# Patient Record
Sex: Female | Born: 1945 | Race: White | Hispanic: No | Marital: Married | Smoking: Former smoker
Health system: Southern US, Community
[De-identification: ages and names within clinical notes are randomized; demographics above are authoritative.]

## PROBLEM LIST (undated history)

## (undated) DIAGNOSIS — T7840XA Allergy, unspecified, initial encounter: Secondary | ICD-10-CM

## (undated) DIAGNOSIS — I10 Essential (primary) hypertension: Secondary | ICD-10-CM

## (undated) HISTORY — DX: Allergy, unspecified, initial encounter: T78.40XA

## (undated) HISTORY — DX: Essential (primary) hypertension: I10

---

## 1992-03-05 HISTORY — PX: VAGINAL HYSTERECTOMY: SUR661

## 1999-11-27 ENCOUNTER — Encounter: Admission: RE | Admit: 1999-11-27 | Discharge: 1999-11-27 | Payer: Self-pay | Admitting: Internal Medicine

## 1999-11-27 ENCOUNTER — Encounter: Payer: Self-pay | Admitting: Internal Medicine

## 2000-09-19 ENCOUNTER — Other Ambulatory Visit: Admission: RE | Admit: 2000-09-19 | Discharge: 2000-09-19 | Payer: Self-pay | Admitting: Internal Medicine

## 2001-01-24 ENCOUNTER — Encounter: Admission: RE | Admit: 2001-01-24 | Discharge: 2001-01-24 | Payer: Self-pay | Admitting: Internal Medicine

## 2001-01-24 ENCOUNTER — Encounter: Payer: Self-pay | Admitting: Internal Medicine

## 2002-01-26 ENCOUNTER — Encounter: Payer: Self-pay | Admitting: Internal Medicine

## 2002-01-26 ENCOUNTER — Encounter: Admission: RE | Admit: 2002-01-26 | Discharge: 2002-01-26 | Payer: Self-pay | Admitting: Internal Medicine

## 2002-07-07 ENCOUNTER — Encounter: Admission: RE | Admit: 2002-07-07 | Discharge: 2002-10-05 | Payer: Self-pay | Admitting: Internal Medicine

## 2003-07-09 ENCOUNTER — Encounter: Admission: RE | Admit: 2003-07-09 | Discharge: 2003-07-09 | Payer: Self-pay | Admitting: Internal Medicine

## 2004-08-01 ENCOUNTER — Encounter: Admission: RE | Admit: 2004-08-01 | Discharge: 2004-08-01 | Payer: Self-pay | Admitting: Internal Medicine

## 2005-08-13 ENCOUNTER — Encounter: Admission: RE | Admit: 2005-08-13 | Discharge: 2005-08-13 | Payer: Self-pay | Admitting: Internal Medicine

## 2006-08-15 ENCOUNTER — Encounter: Admission: RE | Admit: 2006-08-15 | Discharge: 2006-08-15 | Payer: Self-pay | Admitting: Internal Medicine

## 2007-02-24 ENCOUNTER — Encounter: Admission: RE | Admit: 2007-02-24 | Discharge: 2007-02-24 | Payer: Self-pay | Admitting: Internal Medicine

## 2007-08-20 ENCOUNTER — Encounter: Admission: RE | Admit: 2007-08-20 | Discharge: 2007-08-20 | Payer: Self-pay | Admitting: Internal Medicine

## 2009-03-24 ENCOUNTER — Encounter: Admission: RE | Admit: 2009-03-24 | Discharge: 2009-03-24 | Payer: Self-pay | Admitting: Internal Medicine

## 2010-03-17 ENCOUNTER — Encounter
Admission: RE | Admit: 2010-03-17 | Discharge: 2010-03-17 | Payer: Self-pay | Source: Home / Self Care | Attending: Internal Medicine | Admitting: Internal Medicine

## 2010-03-26 ENCOUNTER — Encounter: Payer: Self-pay | Admitting: Internal Medicine

## 2010-03-27 ENCOUNTER — Encounter: Payer: Self-pay | Admitting: Internal Medicine

## 2010-04-21 ENCOUNTER — Other Ambulatory Visit: Payer: Self-pay | Admitting: Internal Medicine

## 2010-04-21 DIAGNOSIS — Z1231 Encounter for screening mammogram for malignant neoplasm of breast: Secondary | ICD-10-CM

## 2010-05-16 ENCOUNTER — Ambulatory Visit
Admission: RE | Admit: 2010-05-16 | Discharge: 2010-05-16 | Disposition: A | Discharge status: Home / Self Care | Payer: PRIVATE HEALTH INSURANCE | Source: Ambulatory Visit | Attending: Internal Medicine | Admitting: Internal Medicine

## 2010-05-16 DIAGNOSIS — Z1231 Encounter for screening mammogram for malignant neoplasm of breast: Secondary | ICD-10-CM

## 2011-04-11 ENCOUNTER — Other Ambulatory Visit: Payer: Self-pay | Admitting: Internal Medicine

## 2011-04-11 DIAGNOSIS — Z1231 Encounter for screening mammogram for malignant neoplasm of breast: Secondary | ICD-10-CM

## 2011-05-21 ENCOUNTER — Ambulatory Visit
Admission: RE | Admit: 2011-05-21 | Discharge: 2011-05-21 | Disposition: A | Discharge status: Home / Self Care | Payer: PRIVATE HEALTH INSURANCE | Source: Ambulatory Visit | Attending: Internal Medicine | Admitting: Internal Medicine

## 2011-05-21 DIAGNOSIS — Z1231 Encounter for screening mammogram for malignant neoplasm of breast: Secondary | ICD-10-CM

## 2012-05-21 ENCOUNTER — Other Ambulatory Visit: Payer: Self-pay

## 2012-06-23 ENCOUNTER — Ambulatory Visit
Admission: RE | Admit: 2012-06-23 | Discharge: 2012-06-23 | Disposition: A | Discharge status: Home / Self Care | Payer: Medicare Other | Source: Ambulatory Visit

## 2012-06-23 DIAGNOSIS — Z1231 Encounter for screening mammogram for malignant neoplasm of breast: Secondary | ICD-10-CM

## 2012-07-08 ENCOUNTER — Encounter: Payer: Self-pay | Admitting: Gastroenterology

## 2012-08-27 ENCOUNTER — Other Ambulatory Visit: Payer: Self-pay | Admitting: Internal Medicine

## 2012-08-27 DIAGNOSIS — Z780 Asymptomatic menopausal state: Secondary | ICD-10-CM

## 2012-09-12 ENCOUNTER — Ambulatory Visit
Admission: RE | Admit: 2012-09-12 | Discharge: 2012-09-12 | Disposition: A | Discharge status: Home / Self Care | Payer: Medicare Other | Source: Ambulatory Visit | Attending: Internal Medicine | Admitting: Internal Medicine

## 2012-09-12 DIAGNOSIS — Z780 Asymptomatic menopausal state: Secondary | ICD-10-CM

## 2013-06-23 ENCOUNTER — Encounter: Payer: Self-pay | Admitting: Gastroenterology

## 2013-06-23 ENCOUNTER — Other Ambulatory Visit: Payer: Self-pay

## 2013-06-23 DIAGNOSIS — Z1231 Encounter for screening mammogram for malignant neoplasm of breast: Secondary | ICD-10-CM

## 2013-07-28 ENCOUNTER — Encounter (INDEPENDENT_AMBULATORY_CARE_PROVIDER_SITE_OTHER): Payer: Self-pay

## 2013-07-28 ENCOUNTER — Ambulatory Visit
Admission: RE | Admit: 2013-07-28 | Discharge: 2013-07-28 | Disposition: A | Discharge status: Home / Self Care | Payer: Medicare Other | Source: Ambulatory Visit

## 2013-07-28 DIAGNOSIS — Z1231 Encounter for screening mammogram for malignant neoplasm of breast: Secondary | ICD-10-CM

## 2013-07-29 ENCOUNTER — Other Ambulatory Visit: Payer: Self-pay | Admitting: Internal Medicine

## 2013-07-29 DIAGNOSIS — R928 Other abnormal and inconclusive findings on diagnostic imaging of breast: Secondary | ICD-10-CM

## 2013-08-12 ENCOUNTER — Ambulatory Visit: Payer: Medicare Other

## 2013-08-12 ENCOUNTER — Ambulatory Visit (AMBULATORY_SURGERY_CENTER): Payer: Self-pay | Admitting: *Deleted

## 2013-08-12 VITALS — Ht 66.0 in | Wt 140.4 lb

## 2013-08-12 DIAGNOSIS — Z1211 Encounter for screening for malignant neoplasm of colon: Secondary | ICD-10-CM

## 2013-08-12 MED ORDER — NA SULFATE-K SULFATE-MG SULF 17.5-3.13-1.6 GM/177ML PO SOLN: 1.0000 | Freq: Once | ORAL | Status: DC

## 2013-08-12 NOTE — Progress Notes (Signed)
No allergies to eggs or soy. No problems with anesthesia.  Pt given Emmi instructions for colonoscopy  No oxygen use  No diet drug use  

## 2013-08-26 ENCOUNTER — Ambulatory Visit (AMBULATORY_SURGERY_CENTER): Payer: Medicare Other | Admitting: Gastroenterology

## 2013-08-26 ENCOUNTER — Encounter: Payer: Self-pay | Admitting: Gastroenterology

## 2013-08-26 VITALS — BP 134/75 | HR 60 | Temp 97.5°F | Resp 21 | Ht 66.0 in | Wt 140.0 lb

## 2013-08-26 DIAGNOSIS — Z1211 Encounter for screening for malignant neoplasm of colon: Secondary | ICD-10-CM

## 2013-08-26 MED ORDER — SODIUM CHLORIDE 0.9 % IV SOLN: 500.0000 mL | INTRAVENOUS | Status: DC

## 2013-08-26 NOTE — Progress Notes (Signed)
Report to PACU, RN, vss, BBS= Clear.  

## 2013-08-26 NOTE — Patient Instructions (Signed)
YOU HAD AN ENDOSCOPIC PROCEDURE TODAY AT THE [facility]: Refer to the procedure report that was given to you for any specific questions about what was found during the examination.  If the procedure report does not answer your questions, please call your gastroenterologist to clarify.  If you requested that your care partner not be given the details of your procedure findings, then the procedure report has been included in a sealed envelope for you to review at your convenience later.  YOU SHOULD EXPECT: Some feelings of bloating in the abdomen. Passage of more gas than usual.  Walking can help get rid of the air that was put into your GI tract during the procedure and reduce the bloating. If you had a lower endoscopy (such as a colonoscopy or flexible sigmoidoscopy) you may notice spotting of blood in your stool or on the toilet paper. If you underwent a bowel prep for your procedure, then you may not have a normal bowel movement for a few days.  DIET: Your first meal following the procedure should be a light meal and then it is ok to progress to your normal diet.  A half-sandwich or bowl of soup is an example of a good first meal.  Heavy or fried foods are harder to digest and may make you feel nauseous or bloated.  Likewise meals heavy in dairy and vegetables can cause extra gas to form and this can also increase the bloating.  Drink plenty of fluids but you should avoid alcoholic beverages for 24 hours.  ACTIVITY: Your care partner should take you home directly after the procedure.  You should plan to take it easy, moving slowly for the rest of the day.  You can resume normal activity the day after the procedure however you should NOT DRIVE or use heavy machinery for 24 hours (because of the sedation medicines used during the test).    SYMPTOMS TO REPORT IMMEDIATELY: A gastroenterologist can be reached at any hour.  During normal business hours, 8:30 AM to 5:00 PM Monday through Friday,  call [phone].  After hours and on weekends, please call the GI answering service at [phone] who will take a message and have the physician on call contact you.   Following lower endoscopy (colonoscopy or flexible sigmoidoscopy):  Excessive amounts of blood in the stool  Significant tenderness or worsening of abdominal pains  Swelling of the abdomen that is new, acute  Fever of 100F or higher    FOLLOW UP: If any biopsies were taken you will be contacted by phone or by letter within the next 1-3 weeks.  Call your gastroenterologist if you have not heard about the biopsies in 3 weeks.  Our staff will call the home number listed on your records the next business day following your procedure to check on you and address any questions or concerns that you may have at that time regarding the information given to you following your procedure. This is a courtesy call and so if there is no answer at the home number and we have not heard from you through the emergency physician on call, we will assume that you have returned to your regular daily activities without incident.  SIGNATURES/CONFIDENTIALITY: You and/or your care partner have signed paperwork which will be entered into your electronic medical record.  These signatures attest to the fact that that the information above on your After Visit Summary has been reviewed and is understood.  Full responsibility of the confidentiality   of this discharge information lies with you and/or your care-partner.     

## 2013-08-26 NOTE — Op Note (Signed)
[facility] [address] [place], [id number]   COLONOSCOPY PROCEDURE REPORT  PATIENT: Jillian MangoOwens, Jillian P.  MR#: [id number] BIRTHDATE: [birth year] , 68  yrs. old GENDER: Female ENDOSCOPIST: Louis Meckelobert D Kaplan, MD REFERRED AO:ZHYQMBY:Edwin Chilton SiGreen, M.D. PROCEDURE DATE:  08/26/2013 PROCEDURE:   Colonoscopy, diagnostic First Screening Colonoscopy - Avg.  risk and is 50 yrs.  old or older - No.  Prior Negative Screening - Now for repeat screening. 10 or more years since last screening  History of Adenoma - Now for follow-up colonoscopy & has been > or = to 3 yrs.  N/A  Polyps Removed Today? No.  Recommend repeat exam, <10 yrs? No. ASA CLASS:   Class II INDICATIONS:Average risk patient for colon cancer. MEDICATIONS: MAC sedation, administered by CRNA and Propofol (Diprivan) 230 mg IV  DESCRIPTION OF PROCEDURE:   After the risks benefits and alternatives of the procedure were thoroughly explained, informed consent was obtained.  A digital rectal exam revealed no abnormalities of the rectum.   The LB [id number]  endoscope was introduced through the anus and advanced to the cecum, which was identified by both the appendix and ileocecal valve. No adverse events experienced.   The quality of the prep was excellent using Suprep  The instrument was then slowly withdrawn as the colon was fully examined.      COLON FINDINGS: A normal appearing cecum, ileocecal valve, and appendiceal orifice were identified.  The ascending, hepatic flexure, transverse, splenic flexure, descending, sigmoid colon and rectum appeared unremarkable.  No polyps or cancers were seen. Retroflexed views revealed no abnormalities. The time to cecum=2 minutes 46 seconds.  Withdrawal time=7 minutes 56 seconds.  The scope was withdrawn and the procedure completed. COMPLICATIONS: There were no complications.  ENDOSCOPIC IMPRESSION: Normal colon  RECOMMENDATIONS: Continue current colorectal screening  recommendations for "routine risk" patients with a repeat colonoscopy in 10 years.   eSigned:  Louis Meckelobert D Kaplan, MD 08/26/2013 11:45 AM   cc:

## 2013-08-27 ENCOUNTER — Telehealth: Payer: Self-pay | Admitting: *Deleted

## 2013-08-27 NOTE — Telephone Encounter (Signed)
No answer, left message to call if questions or concerns. 

## 2013-09-01 ENCOUNTER — Ambulatory Visit
Admission: RE | Admit: 2013-09-01 | Discharge: 2013-09-01 | Disposition: A | Discharge status: Home / Self Care | Payer: Medicare Other | Source: Ambulatory Visit | Attending: Internal Medicine | Admitting: Internal Medicine

## 2013-09-01 ENCOUNTER — Encounter (INDEPENDENT_AMBULATORY_CARE_PROVIDER_SITE_OTHER): Payer: Self-pay

## 2013-09-01 DIAGNOSIS — R928 Other abnormal and inconclusive findings on diagnostic imaging of breast: Secondary | ICD-10-CM

## 2013-10-12 ENCOUNTER — Other Ambulatory Visit (HOSPITAL_COMMUNITY): Payer: Self-pay | Admitting: Unknown Physician Specialty

## 2013-10-12 DIAGNOSIS — R079 Chest pain, unspecified: Secondary | ICD-10-CM

## 2013-10-19 ENCOUNTER — Ambulatory Visit (HOSPITAL_COMMUNITY)
Admission: RE | Admit: 2013-10-19 | Discharge: 2013-10-19 | Disposition: A | Discharge status: Home / Self Care | Payer: Medicare Other | Source: Ambulatory Visit | Attending: Internal Medicine | Admitting: Internal Medicine

## 2013-10-19 DIAGNOSIS — R079 Chest pain, unspecified: Secondary | ICD-10-CM | POA: Diagnosis not present

## 2014-03-08 DIAGNOSIS — J019 Acute sinusitis, unspecified: Secondary | ICD-10-CM | POA: Diagnosis not present

## 2014-03-24 DIAGNOSIS — I10 Essential (primary) hypertension: Secondary | ICD-10-CM | POA: Diagnosis not present

## 2014-03-24 DIAGNOSIS — Z23 Encounter for immunization: Secondary | ICD-10-CM | POA: Diagnosis not present

## 2014-08-10 ENCOUNTER — Other Ambulatory Visit: Payer: Self-pay

## 2014-08-10 DIAGNOSIS — Z1231 Encounter for screening mammogram for malignant neoplasm of breast: Secondary | ICD-10-CM

## 2014-08-17 ENCOUNTER — Ambulatory Visit
Admission: RE | Admit: 2014-08-17 | Discharge: 2014-08-17 | Disposition: A | Discharge status: Home / Self Care | Payer: Medicare Other | Source: Ambulatory Visit

## 2014-08-17 DIAGNOSIS — Z1231 Encounter for screening mammogram for malignant neoplasm of breast: Secondary | ICD-10-CM | POA: Diagnosis not present

## 2014-08-17 DIAGNOSIS — H524 Presbyopia: Secondary | ICD-10-CM | POA: Diagnosis not present

## 2014-08-17 DIAGNOSIS — H52223 Regular astigmatism, bilateral: Secondary | ICD-10-CM | POA: Diagnosis not present

## 2014-08-17 DIAGNOSIS — H5213 Myopia, bilateral: Secondary | ICD-10-CM | POA: Diagnosis not present

## 2015-02-01 DIAGNOSIS — E785 Hyperlipidemia, unspecified: Secondary | ICD-10-CM | POA: Diagnosis not present

## 2015-02-01 DIAGNOSIS — I10 Essential (primary) hypertension: Secondary | ICD-10-CM | POA: Diagnosis not present

## 2015-02-01 DIAGNOSIS — D559 Anemia due to enzyme disorder, unspecified: Secondary | ICD-10-CM | POA: Diagnosis not present

## 2015-02-03 DIAGNOSIS — Z23 Encounter for immunization: Secondary | ICD-10-CM | POA: Diagnosis not present

## 2015-02-03 DIAGNOSIS — Z0000 Encounter for general adult medical examination without abnormal findings: Secondary | ICD-10-CM | POA: Diagnosis not present

## 2015-02-17 ENCOUNTER — Ambulatory Visit
Admission: RE | Admit: 2015-02-17 | Discharge: 2015-02-17 | Disposition: A | Discharge status: Home / Self Care | Payer: Medicare Other | Source: Ambulatory Visit | Attending: Internal Medicine | Admitting: Internal Medicine

## 2015-02-17 ENCOUNTER — Other Ambulatory Visit: Payer: Self-pay | Admitting: Internal Medicine

## 2015-02-17 DIAGNOSIS — M25551 Pain in right hip: Secondary | ICD-10-CM | POA: Diagnosis not present

## 2015-02-17 DIAGNOSIS — S79911A Unspecified injury of right hip, initial encounter: Secondary | ICD-10-CM | POA: Diagnosis not present

## 2015-02-17 DIAGNOSIS — R52 Pain, unspecified: Secondary | ICD-10-CM

## 2015-02-17 DIAGNOSIS — S76312A Strain of muscle, fascia and tendon of the posterior muscle group at thigh level, left thigh, initial encounter: Secondary | ICD-10-CM | POA: Diagnosis not present

## 2015-04-28 DIAGNOSIS — J019 Acute sinusitis, unspecified: Secondary | ICD-10-CM | POA: Diagnosis not present

## 2015-08-12 ENCOUNTER — Other Ambulatory Visit: Payer: Self-pay | Admitting: Internal Medicine

## 2015-08-12 DIAGNOSIS — N6453 Retraction of nipple: Secondary | ICD-10-CM

## 2015-08-12 DIAGNOSIS — Z1231 Encounter for screening mammogram for malignant neoplasm of breast: Secondary | ICD-10-CM

## 2015-08-19 ENCOUNTER — Ambulatory Visit
Admission: RE | Admit: 2015-08-19 | Discharge: 2015-08-19 | Disposition: A | Discharge status: Home / Self Care | Payer: Medicare Other | Source: Ambulatory Visit | Attending: Internal Medicine | Admitting: Internal Medicine

## 2015-08-19 ENCOUNTER — Other Ambulatory Visit: Payer: Self-pay | Admitting: Internal Medicine

## 2015-08-19 DIAGNOSIS — N63 Unspecified lump in breast: Secondary | ICD-10-CM | POA: Diagnosis not present

## 2015-08-19 DIAGNOSIS — N6320 Unspecified lump in the left breast, unspecified quadrant: Secondary | ICD-10-CM

## 2015-08-19 DIAGNOSIS — N6453 Retraction of nipple: Secondary | ICD-10-CM

## 2015-08-19 DIAGNOSIS — R922 Inconclusive mammogram: Secondary | ICD-10-CM | POA: Diagnosis not present

## 2015-08-25 ENCOUNTER — Other Ambulatory Visit: Payer: Self-pay | Admitting: Internal Medicine

## 2015-08-25 ENCOUNTER — Ambulatory Visit
Admission: RE | Admit: 2015-08-25 | Discharge: 2015-08-25 | Disposition: A | Discharge status: Home / Self Care | Payer: Medicare Other | Source: Ambulatory Visit | Attending: Internal Medicine | Admitting: Internal Medicine

## 2015-08-25 DIAGNOSIS — N6320 Unspecified lump in the left breast, unspecified quadrant: Secondary | ICD-10-CM

## 2015-08-25 DIAGNOSIS — N6489 Other specified disorders of breast: Secondary | ICD-10-CM | POA: Diagnosis not present

## 2015-09-17 ENCOUNTER — Other Ambulatory Visit: Payer: Self-pay | Admitting: Internal Medicine

## 2015-09-20 ENCOUNTER — Other Ambulatory Visit: Payer: Self-pay | Admitting: Internal Medicine

## 2015-09-20 DIAGNOSIS — N6453 Retraction of nipple: Secondary | ICD-10-CM

## 2015-09-25 ENCOUNTER — Ambulatory Visit
Admission: RE | Admit: 2015-09-25 | Discharge: 2015-09-25 | Disposition: A | Discharge status: Home / Self Care | Payer: Medicare Other | Source: Ambulatory Visit | Attending: Internal Medicine | Admitting: Internal Medicine

## 2015-09-25 DIAGNOSIS — N6453 Retraction of nipple: Secondary | ICD-10-CM

## 2015-09-25 DIAGNOSIS — N6459 Other signs and symptoms in breast: Secondary | ICD-10-CM | POA: Diagnosis not present

## 2015-09-25 MED ORDER — GADOBENATE DIMEGLUMINE 529 MG/ML IV SOLN
13.0000 mL | Freq: Once | INTRAVENOUS | Status: AC | PRN
  Administered 2015-09-25: 13 mL via INTRAVENOUS

## 2016-02-08 DIAGNOSIS — I10 Essential (primary) hypertension: Secondary | ICD-10-CM | POA: Diagnosis not present

## 2016-02-08 DIAGNOSIS — Z23 Encounter for immunization: Secondary | ICD-10-CM | POA: Diagnosis not present

## 2016-02-08 DIAGNOSIS — Z0001 Encounter for general adult medical examination with abnormal findings: Secondary | ICD-10-CM | POA: Diagnosis not present

## 2016-05-29 DIAGNOSIS — I10 Essential (primary) hypertension: Secondary | ICD-10-CM | POA: Diagnosis not present

## 2016-05-29 DIAGNOSIS — H10019 Acute follicular conjunctivitis, unspecified eye: Secondary | ICD-10-CM | POA: Diagnosis not present

## 2016-07-02 DIAGNOSIS — M25511 Pain in right shoulder: Secondary | ICD-10-CM | POA: Diagnosis not present

## 2016-11-09 IMAGING — MR MR BREAST BILAT WO/W CM
8 of 12 series · 33 of 48 positions shown · IV contrast (multihance)
Comparison: Previous exam(s).

CLINICAL DATA: Left nipple inversion with no clear mammographic or
sonographic correlate/cause.

LABS:  Creatinine 0.9 mg/dl, GFR 62
EXAM:
BILATERAL BREAST MRI WITH AND WITHOUT CONTRAST
TECHNIQUE: Multiplanar, multisequence MR images of both breasts were obtained
prior to and following the intravenous administration of 13 ml of
MultiHance.

[Series 2: t2_tirm_tra ipat (a-p) · axial · 3.0mm · 0.70mm/px · 1 of 55 slices shown]
[im 1/55]
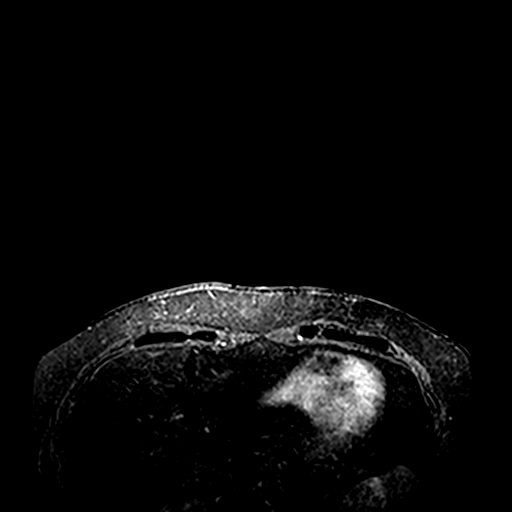

[Series 3: fl3d pre-cm no · axial · non-contrast · 1.2mm · 0.94mm/px · z∈[-65,+107]mm · 5 of 144 slices shown]
[im 1/144]
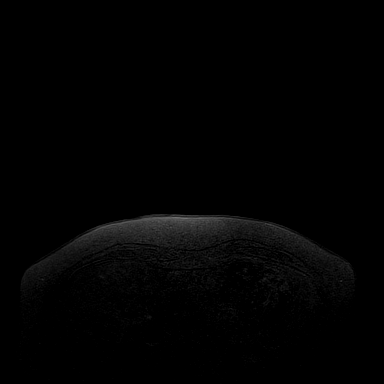
[im 36/144]
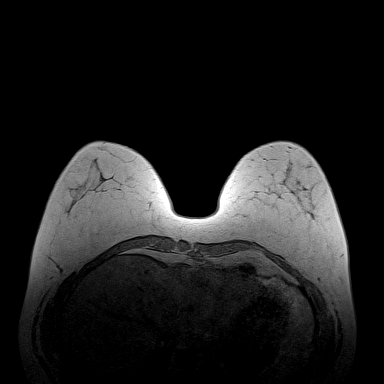
[im 72/144]
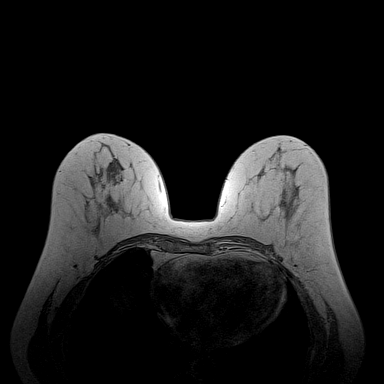
[im 108/144]
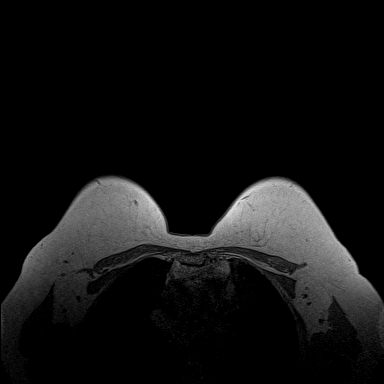
[im 144/144]
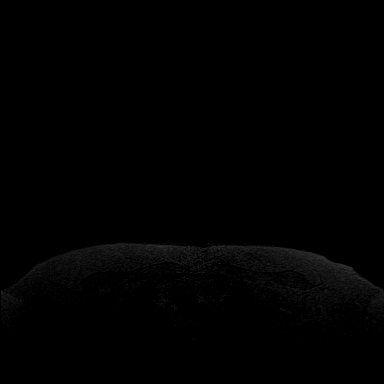

[Series 4: fl3d pre-cm · axial · non-contrast · 1.2mm · 0.94mm/px · z∈[-65,+107]mm · 5 of 144 slices shown]
[im 1/144]
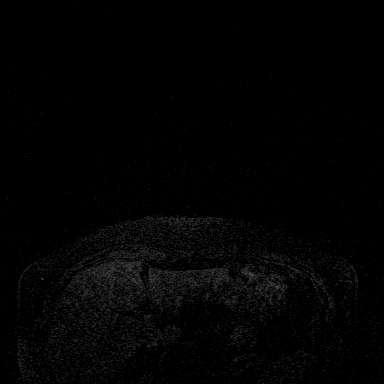
[im 36/144]
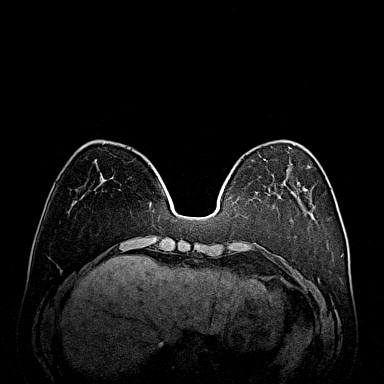
[im 72/144]
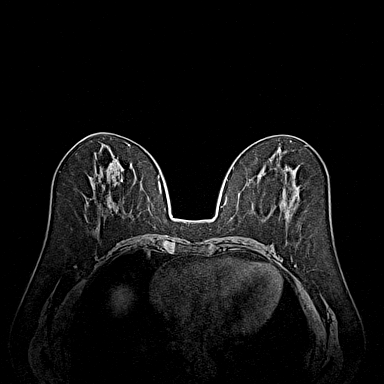
[im 108/144]
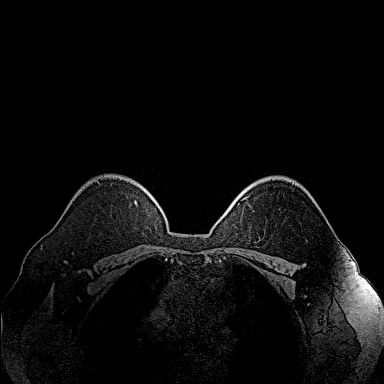
[im 144/144]
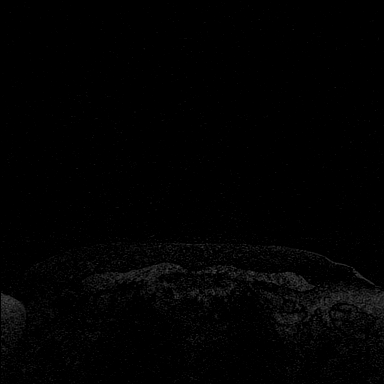

[Series 5: fl3d post immediate · axial · 1.2mm · 0.94mm/px · z∈[-65,+107]mm · 5 of 144 slices shown (1 of 3)]
[im 1/144]
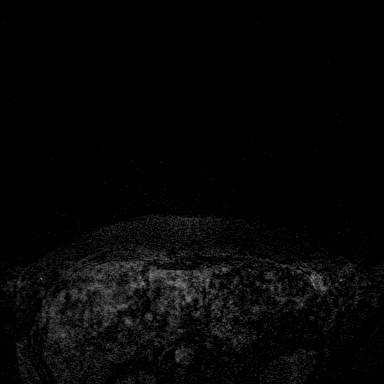
[im 36/144]
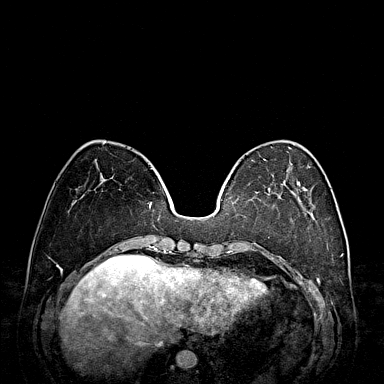
[im 72/144]
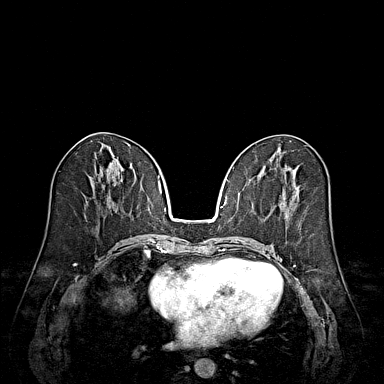
[im 108/144]
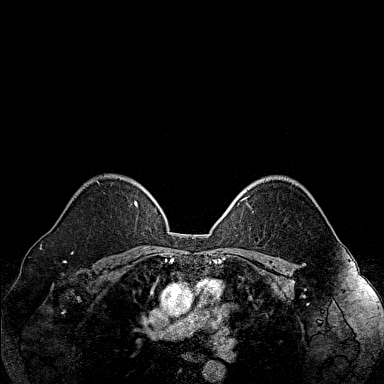
[im 144/144]
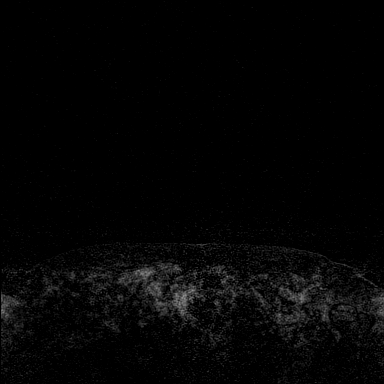

[Series 6: fl3d post immediate · axial · 1.2mm · 0.94mm/px · z∈[-65,+107]mm · 5 of 144 slices shown (2 of 3)]
[im 1/144]
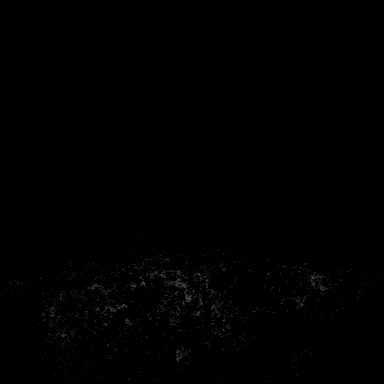
[im 36/144]
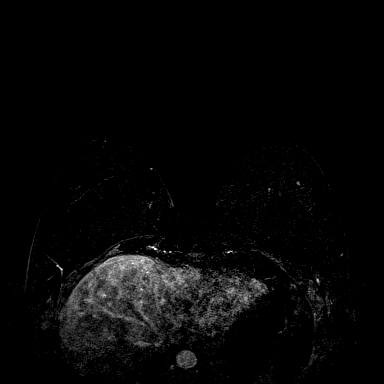
[im 72/144]
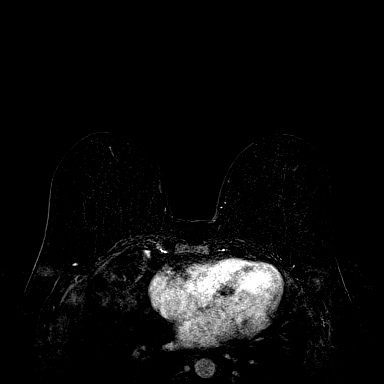
[im 108/144]
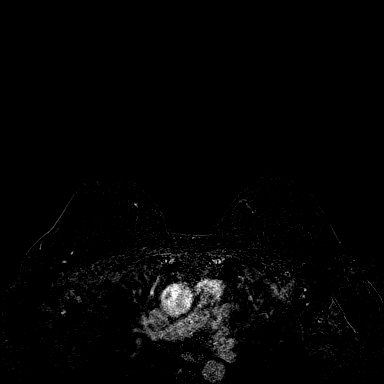
[im 144/144]
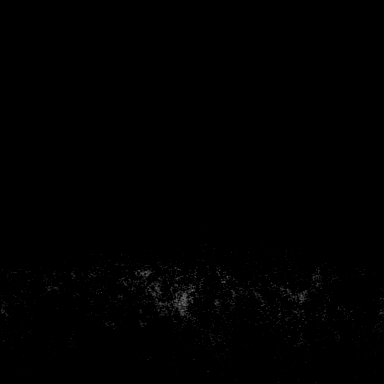

[Series 7: fl3d post immediate · axial · 172.8mm · 0.94mm/px · 1 of 1 slices shown (3 of 3)]
[im 1/1]
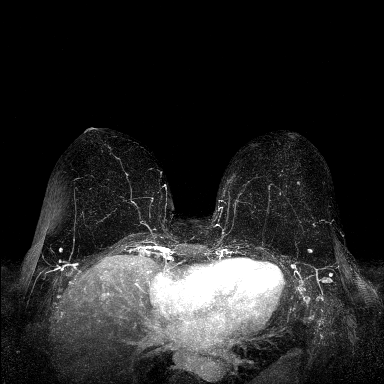

[Series 8: fl3d post 3min · axial · 1.2mm · 0.94mm/px · z∈[-65,+107]mm · 6 of 144 slices shown]
[im 1/144]
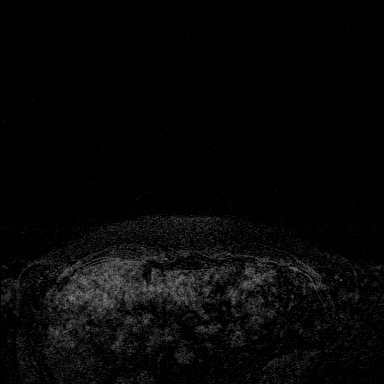
[im 29/144]
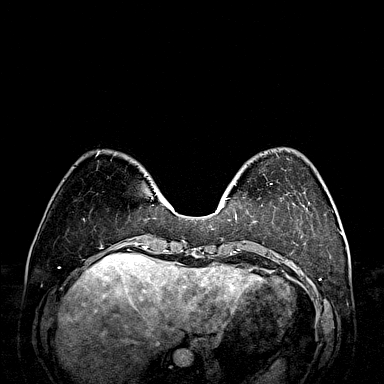
[im 58/144]
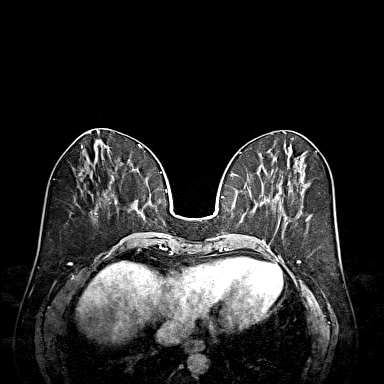
[im 86/144]
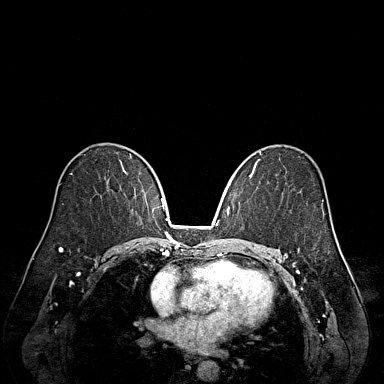
[im 115/144]
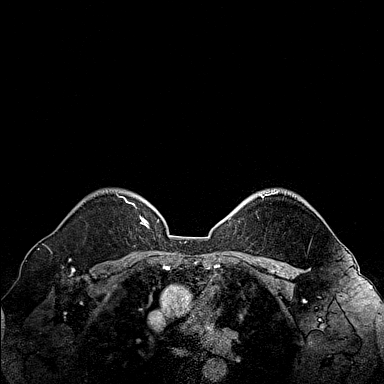
[im 144/144]
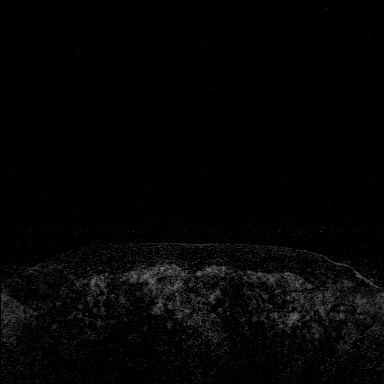

[Series 9: fl3d post 3min_sub · axial · 1.2mm · 0.94mm/px · z∈[-65,+72]mm · 5 of 144 slices shown]
[im 1/144]
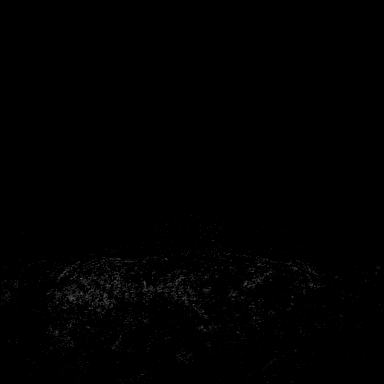
[im 29/144]
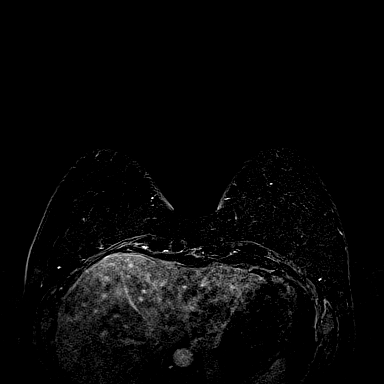
[im 58/144]
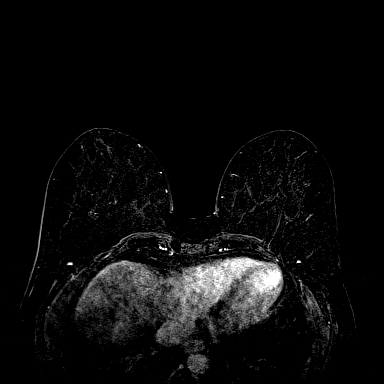
[im 86/144]
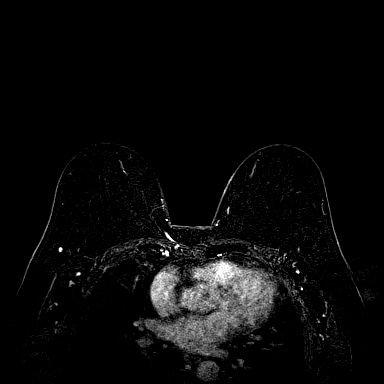
[im 115/144]
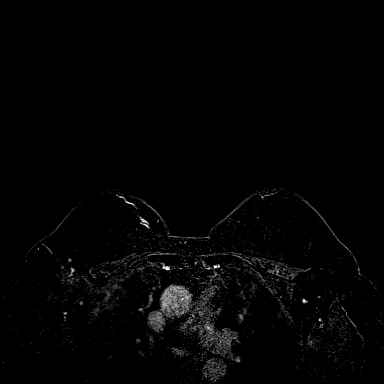

[33 of 48 positions shown; findings below may reference images not displayed]

THREE-DIMENSIONAL MR IMAGE RENDERING ON INDEPENDENT WORKSTATION:

Three-dimensional MR images were rendered by post-processing of the
original MR data on an independent workstation. The
three-dimensional MR images were interpreted, and findings are
reported in the following complete MRI report for this study. Three
dimensional images were evaluated at the independent [employer]
workstation
FINDINGS: Breast composition: Scattered fibroglandular tissue

Background parenchymal enhancement: Mild

Right breast: Several small background enhancing foci with no
suspicious masses or non mass enhancement.

Left breast: Several small background enhancing foci with no
suspicious masses are non mass enhancement.

Lymph nodes: No abnormal appearing lymph nodes.

Ancillary findings:  None.
IMPRESSION: 1. No MRI evidence of malignancy. No MRI correlate to the left
nipple inversion.

RECOMMENDATION:
Treatment of the patient's nipple inversion should be based on
clinical and physical exam given the lack of imaging correlate.
Surgical consultation could be considered. Recommend continued
annual screening mammography next due in Wednesday August, 2016.

BI-RADS CATEGORY  2: Benign.

## 2017-04-25 ENCOUNTER — Other Ambulatory Visit: Payer: Self-pay | Admitting: Internal Medicine

## 2017-04-25 DIAGNOSIS — Z1231 Encounter for screening mammogram for malignant neoplasm of breast: Secondary | ICD-10-CM

## 2017-04-29 ENCOUNTER — Ambulatory Visit
Admission: RE | Admit: 2017-04-29 | Discharge: 2017-04-29 | Disposition: A | Discharge status: Home / Self Care | Payer: Medicare Other | Source: Ambulatory Visit | Attending: Internal Medicine | Admitting: Internal Medicine

## 2017-04-29 DIAGNOSIS — Z1231 Encounter for screening mammogram for malignant neoplasm of breast: Secondary | ICD-10-CM

## 2018-03-28 ENCOUNTER — Other Ambulatory Visit: Payer: Self-pay | Admitting: Internal Medicine

## 2018-03-28 DIAGNOSIS — Z1231 Encounter for screening mammogram for malignant neoplasm of breast: Secondary | ICD-10-CM

## 2018-04-30 ENCOUNTER — Ambulatory Visit
Admission: RE | Admit: 2018-04-30 | Discharge: 2018-04-30 | Disposition: A | Discharge status: Home / Self Care | Payer: Medicare Other | Source: Ambulatory Visit | Attending: Internal Medicine | Admitting: Internal Medicine

## 2018-04-30 DIAGNOSIS — Z1231 Encounter for screening mammogram for malignant neoplasm of breast: Secondary | ICD-10-CM

## 2019-03-28 ENCOUNTER — Ambulatory Visit: Payer: Medicare Other | Attending: Internal Medicine

## 2019-03-28 DIAGNOSIS — Z23 Encounter for immunization: Secondary | ICD-10-CM | POA: Insufficient documentation

## 2019-03-28 NOTE — Progress Notes (Signed)
   Covid-19 Vaccination Clinic  Name:  Jillian Roberts    MRN: [id number] DOB: 22-Jun-1945  03/28/2019  Jillian Roberts was observed post Covid-19 immunization for 15 minutes without incidence. She was provided with Vaccine Information Sheet and instruction to access the V-Safe system.   Jillian Roberts was instructed to call 911 with any severe reactions post vaccine: Marland Kitchen Difficulty breathing  . Swelling of your face and throat  . A fast heartbeat  . A bad rash all over your body  . Dizziness and weakness    Immunizations Administered    Name Date Dose VIS Date Route   Pfizer COVID-19 Vaccine 03/28/2019  2:01 PM 0.3 mL 02/13/2019 Intramuscular   Manufacturer: [employer], [employer]   Lot: [id number]   NDC: [address]-0

## 2019-04-18 ENCOUNTER — Ambulatory Visit: Payer: Medicare Other | Attending: Internal Medicine

## 2019-04-18 DIAGNOSIS — Z23 Encounter for immunization: Secondary | ICD-10-CM | POA: Insufficient documentation

## 2019-04-18 NOTE — Progress Notes (Signed)
   Covid-19 Vaccination Clinic  Name:  Jillian Roberts    MRN: [id number] DOB: 15-Aug-1945  04/18/2019  Jillian Roberts was observed post Covid-19 immunization for 15 minutes without incidence. She was provided with Vaccine Information Sheet and instruction to access the V-Safe system.   Jillian Roberts was instructed to call 911 with any severe reactions post vaccine: Marland Kitchen Difficulty breathing  . Swelling of your face and throat  . A fast heartbeat  . A bad rash all over your body  . Dizziness and weakness    Immunizations Administered    Name Date Dose VIS Date Route   Pfizer COVID-19 Vaccine 04/18/2019 12:55 PM 0.3 mL 02/13/2019 Intramuscular   Manufacturer: [employer], [employer]   Lot: [id number]   NDC: [address]-0

## 2019-05-18 ENCOUNTER — Other Ambulatory Visit: Payer: Self-pay | Admitting: Internal Medicine

## 2019-05-18 DIAGNOSIS — Z1231 Encounter for screening mammogram for malignant neoplasm of breast: Secondary | ICD-10-CM

## 2019-05-18 DIAGNOSIS — R5381 Other malaise: Secondary | ICD-10-CM

## 2019-06-12 ENCOUNTER — Other Ambulatory Visit: Payer: Self-pay

## 2019-06-12 ENCOUNTER — Ambulatory Visit
Admission: RE | Admit: 2019-06-12 | Discharge: 2019-06-12 | Disposition: A | Discharge status: Home / Self Care | Payer: Medicare Other | Source: Ambulatory Visit | Attending: Internal Medicine | Admitting: Internal Medicine

## 2019-06-12 DIAGNOSIS — Z1231 Encounter for screening mammogram for malignant neoplasm of breast: Secondary | ICD-10-CM

## 2019-06-15 ENCOUNTER — Other Ambulatory Visit: Payer: Self-pay | Admitting: Internal Medicine

## 2019-06-15 DIAGNOSIS — N6459 Other signs and symptoms in breast: Secondary | ICD-10-CM

## 2019-07-07 ENCOUNTER — Other Ambulatory Visit: Payer: Self-pay | Admitting: Internal Medicine

## 2019-07-07 DIAGNOSIS — M810 Age-related osteoporosis without current pathological fracture: Secondary | ICD-10-CM

## 2019-07-10 ENCOUNTER — Ambulatory Visit
Admission: RE | Admit: 2019-07-10 | Discharge: 2019-07-10 | Disposition: A | Discharge status: Home / Self Care | Payer: Medicare Other | Source: Ambulatory Visit | Attending: Internal Medicine | Admitting: Internal Medicine

## 2019-07-10 ENCOUNTER — Other Ambulatory Visit: Payer: Self-pay

## 2019-07-10 DIAGNOSIS — N6459 Other signs and symptoms in breast: Secondary | ICD-10-CM

## 2019-07-14 ENCOUNTER — Other Ambulatory Visit: Payer: Self-pay | Admitting: Internal Medicine

## 2019-07-14 DIAGNOSIS — N6459 Other signs and symptoms in breast: Secondary | ICD-10-CM

## 2019-08-13 ENCOUNTER — Other Ambulatory Visit: Payer: Self-pay

## 2019-08-13 ENCOUNTER — Ambulatory Visit
Admission: RE | Admit: 2019-08-13 | Discharge: 2019-08-13 | Disposition: A | Discharge status: Home / Self Care | Payer: Medicare Other | Source: Ambulatory Visit | Attending: Internal Medicine | Admitting: Internal Medicine

## 2019-08-13 DIAGNOSIS — N6459 Other signs and symptoms in breast: Secondary | ICD-10-CM

## 2019-08-13 MED ORDER — GADOBUTROL 1 MMOL/ML IV SOLN
7.0000 mL | Freq: Once | INTRAVENOUS | Status: AC | PRN
  Administered 2019-08-13: 7 mL via INTRAVENOUS

## 2019-09-24 ENCOUNTER — Ambulatory Visit
Admission: RE | Admit: 2019-09-24 | Discharge: 2019-09-24 | Disposition: A | Discharge status: Home / Self Care | Payer: Medicare Other | Source: Ambulatory Visit | Attending: Internal Medicine | Admitting: Internal Medicine

## 2019-09-24 ENCOUNTER — Other Ambulatory Visit: Payer: Self-pay

## 2019-09-24 DIAGNOSIS — M810 Age-related osteoporosis without current pathological fracture: Secondary | ICD-10-CM

## 2020-06-14 ENCOUNTER — Other Ambulatory Visit: Payer: Self-pay | Admitting: Internal Medicine

## 2020-06-14 DIAGNOSIS — Z1231 Encounter for screening mammogram for malignant neoplasm of breast: Secondary | ICD-10-CM

## 2020-08-03 ENCOUNTER — Ambulatory Visit
Admission: RE | Admit: 2020-08-03 | Discharge: 2020-08-03 | Disposition: A | Discharge status: Home / Self Care | Payer: Medicare Other | Source: Ambulatory Visit | Attending: Internal Medicine | Admitting: Internal Medicine

## 2020-08-03 ENCOUNTER — Other Ambulatory Visit: Payer: Self-pay

## 2020-08-03 DIAGNOSIS — Z1231 Encounter for screening mammogram for malignant neoplasm of breast: Secondary | ICD-10-CM

## 2021-09-20 ENCOUNTER — Other Ambulatory Visit: Payer: Self-pay | Admitting: Internal Medicine

## 2021-09-20 DIAGNOSIS — Z1231 Encounter for screening mammogram for malignant neoplasm of breast: Secondary | ICD-10-CM

## 2021-10-11 ENCOUNTER — Ambulatory Visit: Payer: Medicare Other

## 2022-01-12 ENCOUNTER — Ambulatory Visit: Payer: Medicare Other

## 2022-03-14 ENCOUNTER — Ambulatory Visit
Admission: RE | Admit: 2022-03-14 | Discharge: 2022-03-14 | Disposition: A | Discharge status: Home / Self Care | Payer: Medicare Other | Source: Ambulatory Visit | Attending: Internal Medicine | Admitting: Internal Medicine

## 2022-03-14 DIAGNOSIS — Z1231 Encounter for screening mammogram for malignant neoplasm of breast: Secondary | ICD-10-CM

## 2022-04-10 ENCOUNTER — Other Ambulatory Visit: Payer: Self-pay | Admitting: Internal Medicine

## 2022-04-10 DIAGNOSIS — M8580 Other specified disorders of bone density and structure, unspecified site: Secondary | ICD-10-CM

## 2022-09-25 ENCOUNTER — Other Ambulatory Visit: Payer: Self-pay

## 2022-09-25 ENCOUNTER — Ambulatory Visit: Payer: Medicare Other | Attending: Obstetrics and Gynecology

## 2022-09-25 DIAGNOSIS — R279 Unspecified lack of coordination: Secondary | ICD-10-CM | POA: Insufficient documentation

## 2022-09-25 DIAGNOSIS — M6281 Muscle weakness (generalized): Secondary | ICD-10-CM | POA: Insufficient documentation

## 2022-09-25 DIAGNOSIS — R339 Retention of urine, unspecified: Secondary | ICD-10-CM | POA: Insufficient documentation

## 2022-09-25 DIAGNOSIS — N393 Stress incontinence (female) (male): Secondary | ICD-10-CM | POA: Diagnosis present

## 2022-09-25 NOTE — Therapy (Signed)
OUTPATIENT PHYSICAL THERAPY FEMALE PELVIC EVALUATION   Patient Name: Jillian Roberts MRN: [id number] DOB:19-Jul-1945, 77 y.o., female Today's Date: 09/25/2022  END OF SESSION:  PT End of Session - 09/25/22 1143     Visit Number 1    Date for PT Re-Evaluation 11/20/22    Authorization Type [employer] Medicare    PT Start Time 1143    PT Stop Time 1218    PT Time Calculation (min) 35 min    Activity Tolerance Patient tolerated treatment well    Behavior During Therapy WFL for tasks assessed/performed             Past Medical History:  Diagnosis Date   Allergy    Hypertension    Past Surgical History:  Procedure Laterality Date   VAGINAL HYSTERECTOMY  1994   There are no problems to display for this patient.   PCP: Melida Quitter, MD  REFERRING PROVIDER: Zelphia Cairo, MD   REFERRING DIAG: [id number] (ICD-10-CM) - Mixed incontinence  THERAPY DIAG:  Muscle weakness (generalized)  Unspecified lack of coordination  Stress incontinence (female) (female)  Incomplete bladder emptying  Rationale for Evaluation and Treatment: Rehabilitation  ONSET DATE: 1 year  SUBJECTIVE:                                                                                                                                                                                           SUBJECTIVE STATEMENT: Pt states that she doesn't really leak, but she has trouble completely emptying her bladder. She'll sit down and push, then right after she finishes she feels like she could go again. She does leak with laughing/coughing/jumping.  Fluid intake: Yes: she is working on drinking a lot of water, no caffeine, occasional glass of wine, no carbonation    PAIN:  Are you having pain? No   PRECAUTIONS: None  RED FLAGS: None   WEIGHT BEARING RESTRICTIONS: No  FALLS:  Has patient fallen in last 6 months? No  LIVING ENVIRONMENT: Lives with: lives with their family Lives in:  House/apartment   OCCUPATION: retired Radiation protection practitioner  PLOF: Independent  PATIENT GOALS: empty bladder and not have to wear pad when she plays golf - play with grand kids and great grand kids.   PERTINENT HISTORY:  Vaginal partial hysterectomy, endometriosis (?) Sexual abuse: No  BOWEL MOVEMENT: Pain with bowel movement: No Type of bowel movement:Frequency irregular and Strain Yes - needs suppository to have daily bowel movement; usually goes between loose and hard stool.  Fully empty rectum: Yes: - - hard to get started Leakage: No Pads: No Fiber supplement: No  URINATION: Pain with  urination: No Fully empty bladder: No Stream:  hard to start, stops and starts Urgency: No Frequency: every 2 hours, gets up 1-2x/night Leakage: Coughing, Sneezing, and jumping, sudden movements Pads: Yes: just when she plays golf   INTERCOURSE: Pain with intercourse:  not having intercourse due to pain  PREGNANCY: Vaginal deliveries 2 Tearing Yes: episiotomy   PROLAPSE: none  OBJECTIVE:  09/25/22: COGNITION: Overall cognitive status: Within functional limits for tasks assessed     SENSATION: Light touch: Appears intact Proprioception: Appears intact  FUNCTIONAL TESTS:  Breath holding observed with bed mobility  GAIT: Comments: WNL  POSTURE: rounded shoulders, forward head, decreased lumbar lordosis, increased thoracic kyphosis, and posterior pelvic tilt   PALPATION:   General  mild bil lower abdominal tightness                External Perineal Exam dryness                             Internal Pelvic Floor WNL, no tenderness reported  Patient confirms identification and approves PT to assess internal pelvic floor and treatment Yes  PELVIC MMT:   MMT eval  Vaginal 2/5, 4 repeat contractoins, 6 sec hold  Diastasis Recti 2 finger width separation at and above umbilicus  (Blank rows = not tested)        TONE: Low   PROLAPSE: Mild anterior vaginal wall laxity noted  - suspect more, but only tested in supine today  TODAY'S TREATMENT:                                                                                                                              DATE:  09/25/22  EVAL  Neuromuscular re-education: Pelvic floor contraction training Quick flicks Long holds    PATIENT EDUCATION:  Education details: See above Person educated: Patient Education method: Programmer, multimedia, Demonstration, Tactile cues, Verbal cues, and Handouts Education comprehension: verbalized understanding  HOME EXERCISE PROGRAM: [id number]   ASSESSMENT:  CLINICAL IMPRESSION: Patient is a 77 y.o. female who was seen today for physical therapy evaluation and treatment for incomplete bladder emptying and stress urinary incontinence. Exam findings notable for abnormal posture, lower abdominal tenderness, low tone pelvic floor, pelvic floor strength 2/5, pelvic floor endurance 6 seconds, 4 repeat contraction, 2 finger width diastasis recti with distortion upon increased abdominal pressure, and mild anterior vaginal wall laxity. Signs and symptoms are most consistent with poor pressure management and pelvic floor muscle weakness. Initial treatment consisted of pelvic floor muscle contraction training. She will continue to benefit from skilled PT intervention in order to increase pelvic floor/core strength, improve urinary incontinence and bladder emptying, and begin/progress functional strengthening program.   OBJECTIVE IMPAIRMENTS: decreased activity tolerance, decreased coordination, decreased endurance, decreased strength, increased fascial restrictions, increased muscle spasms, impaired tone, postural dysfunction, and pain.   ACTIVITY LIMITATIONS:  laughing/coughing/sneezing, jumping, changing direction quickly, emptying bladder completely  PARTICIPATION LIMITATIONS:  playing golf  PERSONAL FACTORS: 1 comorbidity: medical history  are also affecting patient's functional outcome.    REHAB POTENTIAL: Good  CLINICAL DECISION MAKING: Stable/uncomplicated  EVALUATION COMPLEXITY: Low   GOALS: Goals reviewed with patient? Yes  SHORT TERM GOALS: Target date: 10/23/22  Pt will be independent with HEP.   Baseline: Goal status: INITIAL  2.  Pt will be independent with the knack, urge suppression technique, and double voiding in order to improve bladder habits and decrease urinary incontinence.   Baseline:  Goal status: INITIAL  3.  Pt will be independent with use of squatty potty, relaxed toileting mechanics, and improved bowel movement techniques in order to increase ease of bowel movements and complete evacuation.   Baseline:  Goal status: INITIAL   LONG TERM GOALS: Target date: 11/20/22  Pt will be independent with advanced HEP.   Baseline:  Goal status: INITIAL  2.  Pt will demonstrate normal pelvic floor muscle tone and A/ROM, able to achieve 4/5 strength with contractions and 10 sec endurance, in order to provide appropriate lumbopelvic support in functional activities.   Baseline:  Goal status: INITIAL  3.  Pt will report no leaks with laughing, coughing, sneezing, quick change of direction in order to improve comfort with interpersonal relationships and community activities.   Baseline:  Goal status: INITIAL  4.  Pt will be able to play golf without wearing pad or any episodes of leaking. Baseline:  Goal status: INITIAL  5.  Pt will report sensation of complete bladder emptying and not have to use bathroom for 2-3 hours after each void.  Baseline:  Goal status: INITIAL   PLAN:  PT FREQUENCY: 1-2x/week  PT DURATION: 6 months  PLANNED INTERVENTIONS: Therapeutic exercises, Therapeutic activity, Neuromuscular re-education, Balance training, Gait training, Patient/Family education, Self Care, Joint mobilization, Dry Needling, Biofeedback, and Manual therapy  PLAN FOR NEXT SESSION: GO over toilet mechanics for bowel/bladder emptying,  double-voiding; begin core training.    Julio Alm, PT, DPT07/23/2412:28 PM

## 2022-09-28 ENCOUNTER — Ambulatory Visit: Admission: RE | Admit: 2022-09-28 | Payer: Medicare Other | Source: Ambulatory Visit

## 2022-09-28 DIAGNOSIS — M8580 Other specified disorders of bone density and structure, unspecified site: Secondary | ICD-10-CM

## 2023-02-12 ENCOUNTER — Other Ambulatory Visit: Payer: Self-pay | Admitting: Internal Medicine

## 2023-02-12 DIAGNOSIS — Z1231 Encounter for screening mammogram for malignant neoplasm of breast: Secondary | ICD-10-CM

## 2023-03-18 ENCOUNTER — Ambulatory Visit
Admission: RE | Admit: 2023-03-18 | Discharge: 2023-03-18 | Disposition: A | Discharge status: Home / Self Care | Payer: Medicare Other | Source: Ambulatory Visit | Attending: Internal Medicine | Admitting: Internal Medicine

## 2023-03-18 DIAGNOSIS — Z1231 Encounter for screening mammogram for malignant neoplasm of breast: Secondary | ICD-10-CM

## 2024-03-16 ENCOUNTER — Other Ambulatory Visit: Payer: Self-pay | Admitting: Internal Medicine

## 2024-03-16 DIAGNOSIS — Z1231 Encounter for screening mammogram for malignant neoplasm of breast: Secondary | ICD-10-CM

## 2024-03-24 ENCOUNTER — Ambulatory Visit
Admission: RE | Admit: 2024-03-24 | Discharge: 2024-03-24 | Disposition: A | Source: Ambulatory Visit | Attending: Internal Medicine

## 2024-03-24 DIAGNOSIS — Z1231 Encounter for screening mammogram for malignant neoplasm of breast: Secondary | ICD-10-CM
